# Patient Record
Sex: Male | Born: 1955 | Race: White | Hispanic: No | Marital: Single
Health system: Southern US, Community
[De-identification: ages and names within clinical notes are randomized; demographics above are authoritative.]

---

## 2006-02-27 ENCOUNTER — Encounter: Admission: RE | Admit: 2006-02-27 | Discharge: 2006-02-27 | Payer: Self-pay | Admitting: Cardiology

## 2006-03-05 ENCOUNTER — Ambulatory Visit (HOSPITAL_COMMUNITY): Admission: RE | Admit: 2006-03-05 | Discharge: 2006-03-05 | Payer: Self-pay | Admitting: Cardiology

## 2010-10-19 ENCOUNTER — Inpatient Hospital Stay: Payer: Self-pay | Admitting: Psychiatry

## 2011-05-13 ENCOUNTER — Inpatient Hospital Stay: Payer: Self-pay | Admitting: Psychiatry

## 2011-10-17 ENCOUNTER — Observation Stay: Payer: Self-pay | Admitting: Internal Medicine

## 2011-10-17 LAB — CBC
HCT: 43.9 % (ref 40.0–52.0)
HGB: 14.5 g/dL (ref 13.0–18.0)
MCHC: 33.1 g/dL (ref 32.0–36.0)
MCV: 88 fL (ref 80–100)
RBC: 5 10*6/uL (ref 4.40–5.90)
RDW: 14 % (ref 11.5–14.5)
WBC: 8.8 10*3/uL (ref 3.8–10.6)

## 2011-10-17 LAB — DRUG SCREEN, URINE
Barbiturates, Ur Screen: NEGATIVE (ref ?–200)
Cannabinoid 50 Ng, Ur ~~LOC~~: POSITIVE (ref ?–50)
MDMA (Ecstasy)Ur Screen: NEGATIVE (ref ?–500)
Methadone, Ur Screen: NEGATIVE (ref ?–300)
Opiate, Ur Screen: NEGATIVE (ref ?–300)
Tricyclic, Ur Screen: NEGATIVE (ref ?–1000)

## 2011-10-17 LAB — COMPREHENSIVE METABOLIC PANEL
Anion Gap: 9 (ref 7–16)
Calcium, Total: 9.1 mg/dL (ref 8.5–10.1)
Creatinine: 0.84 mg/dL (ref 0.60–1.30)
EGFR (African American): >60
EGFR (Non-African Amer.): >60
Glucose: 266 mg/dL — ABNORMAL HIGH (ref 65–99)
Osmolality: 279 (ref 275–301)
Potassium: 3.7 mmol/L (ref 3.5–5.1)
SGPT (ALT): 36 U/L

## 2011-10-17 LAB — URINALYSIS, COMPLETE
Bacteria: NONE SEEN
Bilirubin,UR: NEGATIVE
Leukocyte Esterase: NEGATIVE
Nitrite: NEGATIVE
Ph: 6 (ref 4.5–8.0)
Specific Gravity: 1.055 (ref 1.003–1.030)
WBC UR: 1 /HPF (ref 0–5)

## 2011-10-17 LAB — TROPONIN I: Troponin-I: <0.02 ng/mL

## 2011-10-18 LAB — CBC WITH DIFFERENTIAL/PLATELET
Basophil %: 0.9 %
Eosinophil #: 0.2 10*3/uL (ref 0.0–0.7)
HCT: 41.1 % (ref 40.0–52.0)
HGB: 13.6 g/dL (ref 13.0–18.0)
Lymphocyte %: 28 %
Platelet: 235 10*3/uL (ref 150–440)
RBC: 4.71 10*6/uL (ref 4.40–5.90)
RDW: 14.1 % (ref 11.5–14.5)
WBC: 7.4 10*3/uL (ref 3.8–10.6)

## 2011-10-18 LAB — LIPID PANEL
HDL Cholesterol: 21 mg/dL — ABNORMAL LOW (ref 40–60)
Triglycerides: 497 mg/dL — ABNORMAL HIGH (ref 0–200)

## 2011-10-18 LAB — BASIC METABOLIC PANEL
Anion Gap: 9 (ref 7–16)
Chloride: 98 mmol/L (ref 98–107)
Co2: 27 mmol/L (ref 21–32)
EGFR (African American): >60
EGFR (Non-African Amer.): >60
Osmolality: 276 (ref 275–301)
Potassium: 3.6 mmol/L (ref 3.5–5.1)
Sodium: 134 mmol/L — ABNORMAL LOW (ref 136–145)

## 2011-10-18 LAB — HEMOGLOBIN A1C: Hemoglobin A1C: 11.8 % — ABNORMAL HIGH (ref 4.2–6.3)

## 2011-10-18 LAB — CK TOTAL AND CKMB (NOT AT ARMC)
CK, Total: 71 U/L (ref 35–232)
CK-MB: 1.3 ng/mL (ref 0.5–3.6)

## 2011-10-18 LAB — TROPONIN I: Troponin-I: <0.02 ng/mL

## 2013-10-01 IMAGING — CT CT CHEST W/ CM
1 of 2 series · 15 of 35 positions shown, 19 images · non-contrast
Comparison: none

REASON FOR EXAM: pain and to also rule out dissection
COMMENTS:

PROCEDURE:     CT  - CT CHEST (FOR PE) W  - October 17, 2011  [DATE]
RESULT:     Chest CT dated 10/17/2011 comparison made to prior study dated
10/18/2010.
TECHNIQUE: Helical 3mm sections were obtained from the thoracic inlet
through the lung bases status post intravenous administration of 100 mL of
Ksovue-I89.

[Series 4: soft tissue · axial · 0.81mm/px · z∈[-382,-91]mm · 15 of 105 slices shown, 19 images]
[im 4/105  mediastinal]
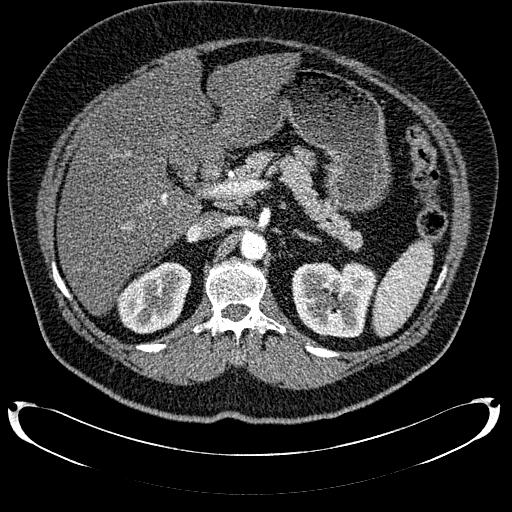
[im 4/105  lung]
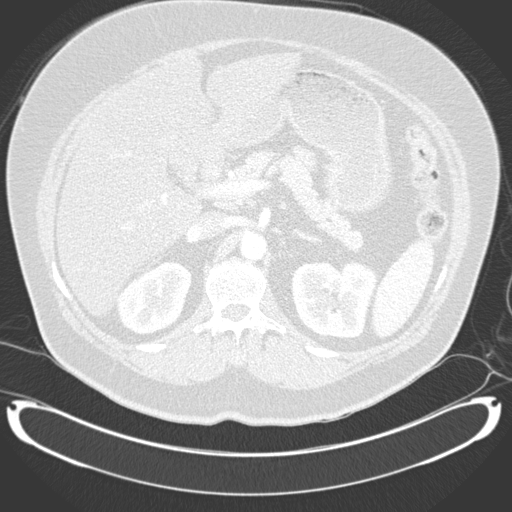
[im 12/105  lung]
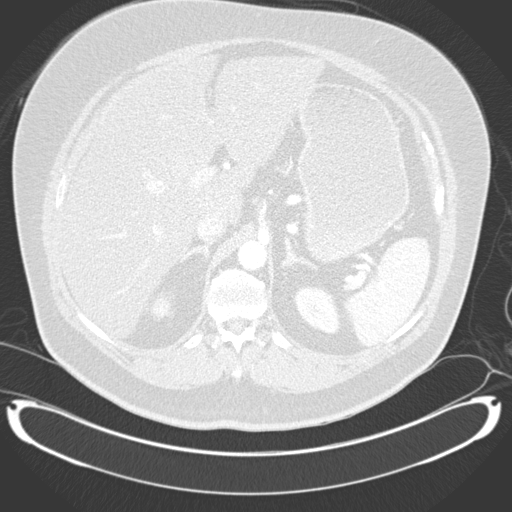
[im 20/105  lung]
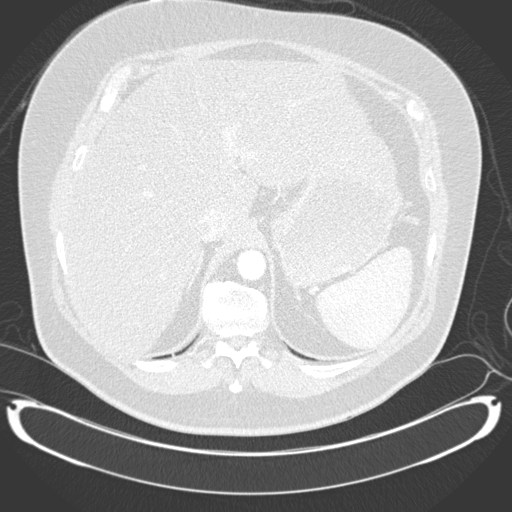
[im 27/105  lung]
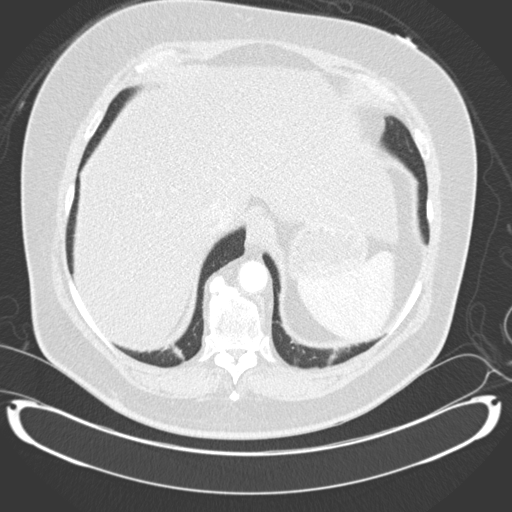
[im 35/105  mediastinal]
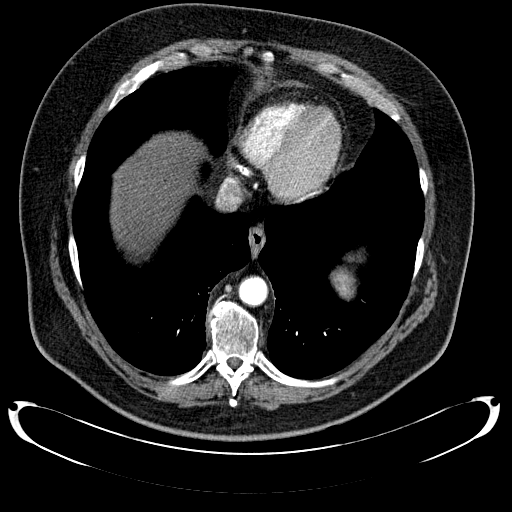
[im 35/105  lung]
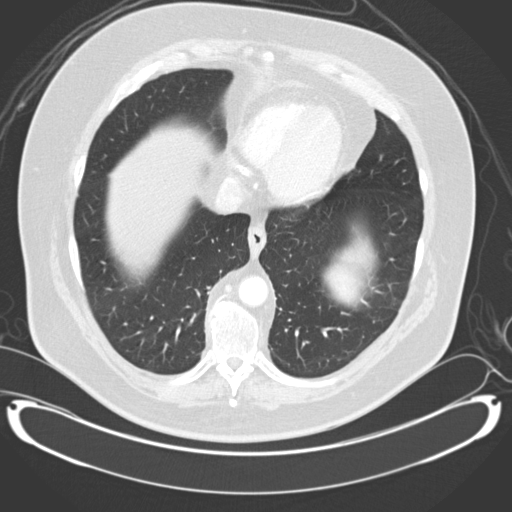
[im 39/105  lung]
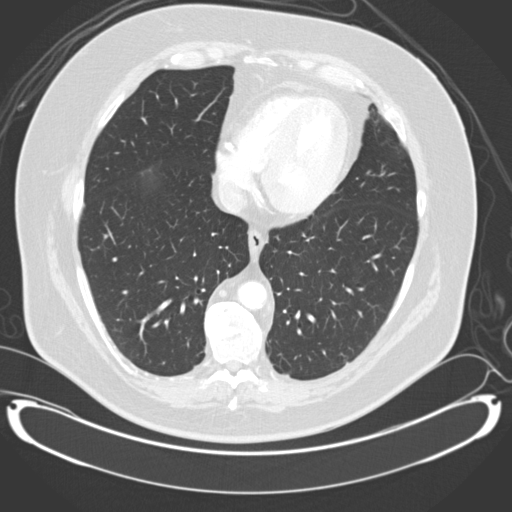
[im 47/105  lung]
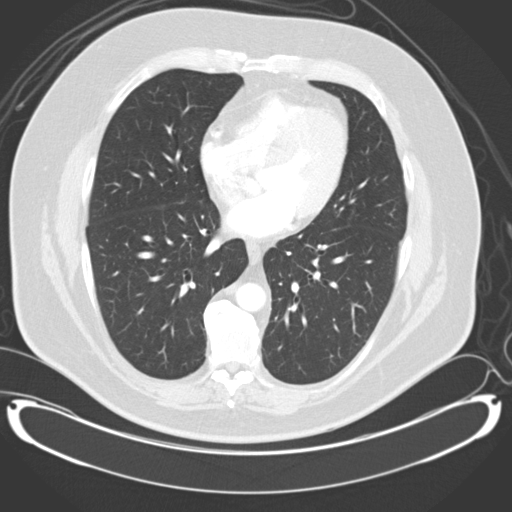
[im 54/105  lung]
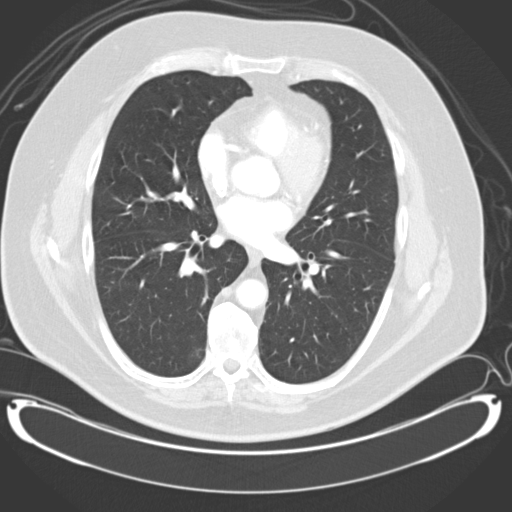
[im 59/105  mediastinal]
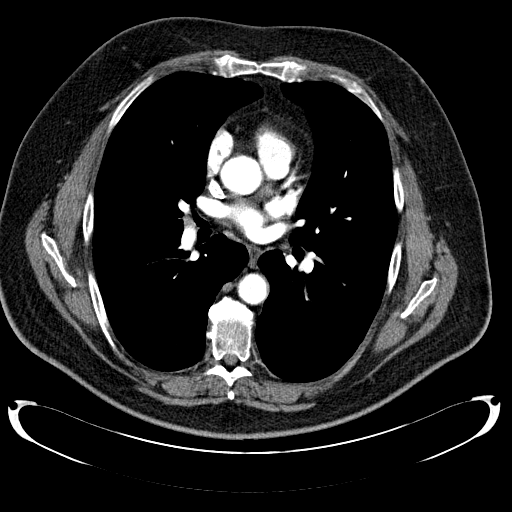
[im 59/105  lung]
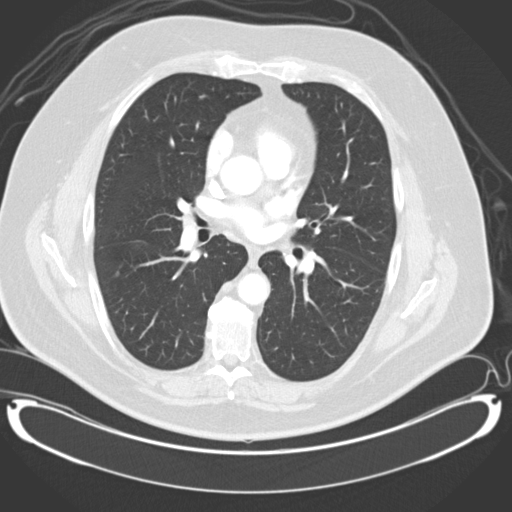
[im 66/105  lung]
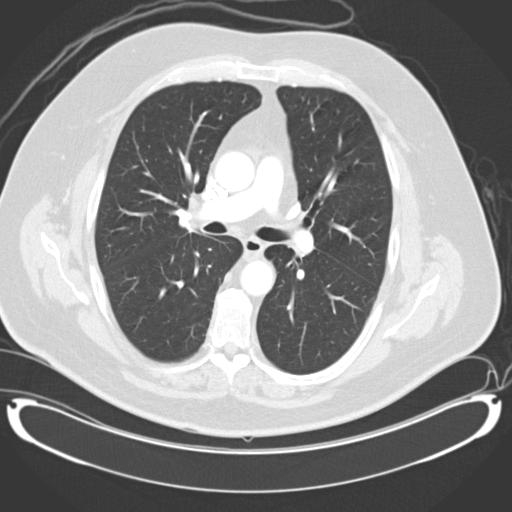
[im 70/105  lung]
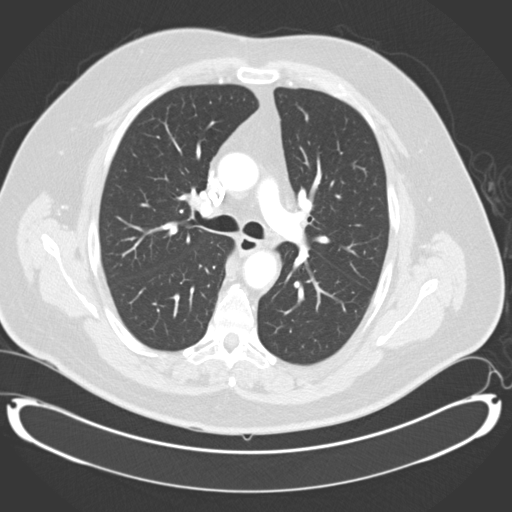
[im 78/105  lung]
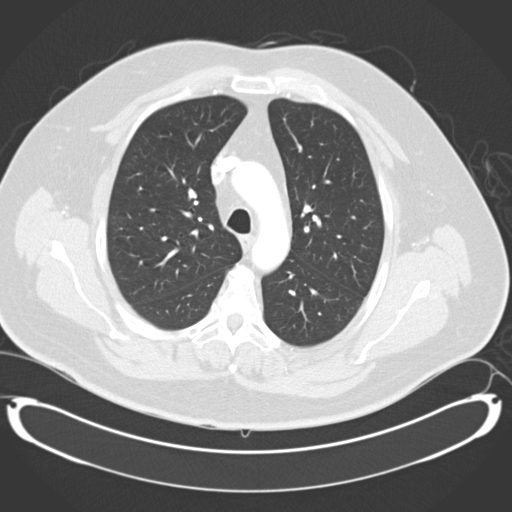
[im 85/105  mediastinal]
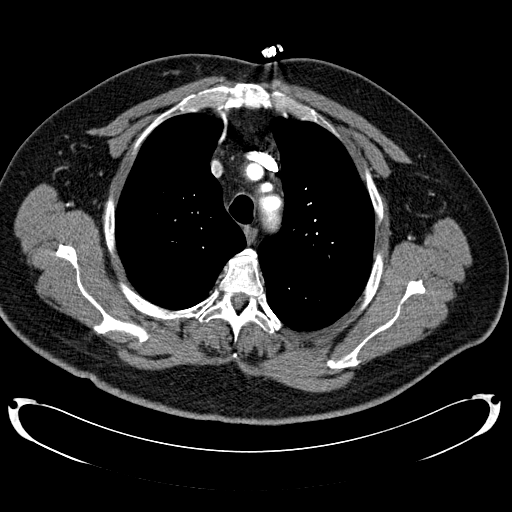
[im 85/105  lung]
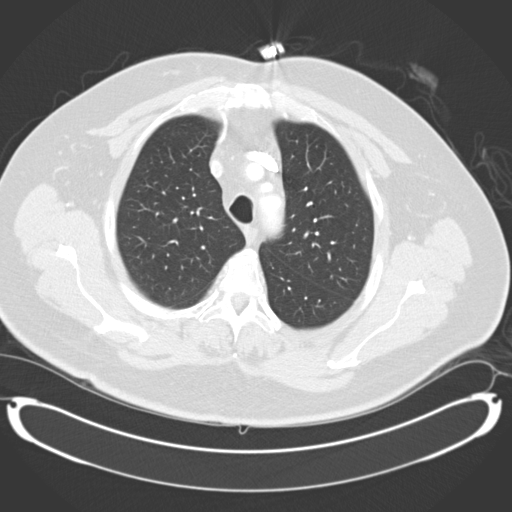
[im 93/105  lung]
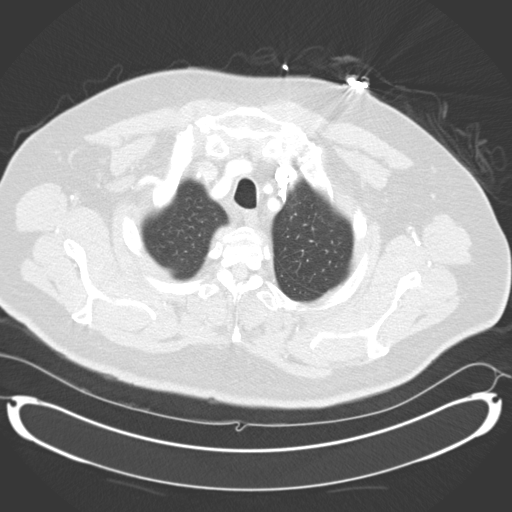
[im 101/105  lung]
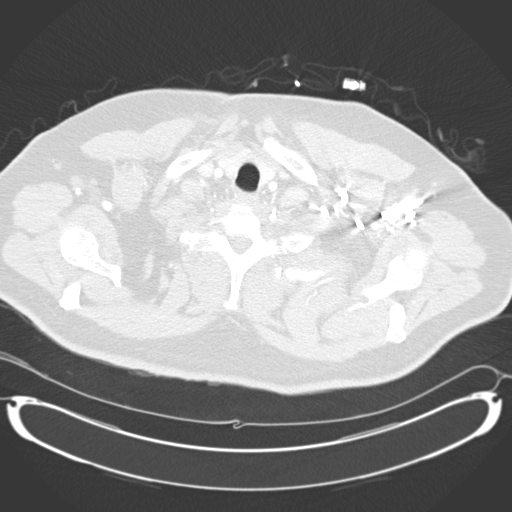

[15 of 35 positions shown; findings below may reference images not displayed]

FINDINGS: The mediastinum hilar regions and structures demonstrate no
evidence of mediastinal or hilar adenopathy nor masses. No filling defects
are appreciated within the main, lobar, or segmental pulmonary arteries.

The lung parenchyma demonstrates no evidence of focal infiltrates,
effusions, edema, masses, nor nodules.  Areas of increased density are
vaguely appreciated within the lung bases like representing fibrosis or
atelectasis.

There is diffuse low-attenuation within the liver. The remaining visualized
upper abdominal viscera are unremarkable.
IMPRESSION: 1. No CT evidence of pulmonary arterial embolic disease.
2. Hepatic steatosis.

## 2014-10-30 NOTE — H&P (Signed)
PATIENT NAME:  Anthony Lloyd, Anthony Lloyd MR#:  [id number] DATE OF BIRTH:  06/26/1956  DATE OF ADMISSION:  10/17/2011  REFERRING PHYSICIAN:  Dr. Ladona Ridgel PRIMARY CARE PHYSICIAN:  [facility] PRIMARY PAIN MANAGEMENT SPECIALIST:  [facility] PRIMARY PSYCHIATRIST:  Dr. Marguerite Olea at [facility]  CHIEF COMPLAINT: "My blood pressure is really high and I am hurting in my chest."   HISTORY OF PRESENT ILLNESS: The patient is a 59 year old male with past medical history listed below including hypertension, diabetes mellitus, depression, chronic back pain, neuropathy, history of suicidality who presented to the Emergency Department with the above-mentioned chief complaint. The patient states that he ran out of his blood pressure medication last night and has not taken any of his blood pressure medications today. He has had some discomfort in the center of his chest. He came to the ER and was noted to have malignant hypertension with blood pressure 241/135. He states he has been hurting in the center of his chest on and off for most of his life, but this has been worse over the past couple of weeks. He reports substernal chest pain which is nonradiating and nonexertional; pain is actually better with moving and walking around and worse while he is sitting. He did take some aspirin today, which led to mild relief of his chest pain. His chest pain is characterized as a sharp sensation in the center of his chest.  His current chest pain is 8.5/10 in intensity. He states that he has had chest pain most of his life, which is attributed to his history of kyphosis. He denies shortness of breath. Otherwise, he is without specific complaints at this time. Given chest pain and malignant hypertension, hospitalist services were contacted for further evaluation and hospital admission.   PAST SURGICAL HISTORY:  1. Low back surgery times two.  2. Sinus surgery. 3. Left rotator cuff surgery. 4. Lymph node in the abdomen. 5. Eye  surgery.   PAST MEDICAL HISTORY:  1. Hypertension.  2. Type 2 diabetes mellitus. 3. Chronic back pain.  4. History of rotator cuff problems involving the right shoulder and also history of rotator cuff surgery on the left side.  5. History of kyphosis.  6. History of depression.  7. 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  8. History of posttraumatic stress disorder which is chronic.  9. History of panic disorder with agoraphobia.  10. 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  11. History of suicidality with several suicide attempts in the past.  12. History of anxiety. 13. Chronic chest pain.  The patient states he has had chest pain for most of his life and attributes this to history of kyphosis.   ALLERGIES: Penicillin and bee stings.   HOME MEDICATIONS:  1. Aspirin 325 mg daily.  2. Norvasc. He thinks he is taking 5 mg daily. 3. Lisinopril. He thinks he is taking either 5 or 10 mg a day. 4. HCTZ. He thinks he is taking 25 mg a day. 5. Coreg 25 mg once a day, but in the past he was advised to take this on a b.i.d. basis.  6. Glucophage daily.  7. Glipizide daily.  8. Gabapentin unknown strength  t.i.d.  9. Cymbalta 90 mg daily. 10. Abilify 5 mg daily.   On the most recent discharge summary from behavior medicine November 2012 he was supposed to have been on the following medications:   1. Amitriptyline 100 mg at bedtime.  2. Norvasc  5 mg daily.  3. Abilify 2 mg daily. 4. Aspirin 325 mg daily. 5. Coreg 25 mg b.i.d.  6. Neurontin 300 mg t.i.d. 7. HCTZ 25 mg daily. 8. Lisinopril 10 mg daily. 9. Metformin 500 mg b.i.d.  10. Multivitamin 1 tablet daily. 11. Pravastatin 20 mg daily.   FAMILY HISTORY:  Significant for heart disease in multiple members with heart attacks on his paternal side. Father also had died in his 550s secondary to myocardial infarction. Sister died of pancreatic cancer. Mother died at  age 59 secondary to pancreatic cancer.   SOCIAL HISTORY: Negative for tobacco, alcohol, or illicit drug use.   REVIEW OF SYSTEMS:  CONSTITUTIONAL: Reports chest pain. Denies fevers, chills, recent change in weight, or weakness.  No fatigue.  HEAD/EYES:  Denies headache or blurry vision. ENT: Denies tinnitus, earache, nasal discharge, or sore throat. RESPIRATORY: Denies shortness breath, cough, or wheezing. CARDIOVASCULAR: Reports chest pain. Denies heart palpitations or lower extremity edema. GI: Denies nausea, vomiting, diarrhea, constipation, melena, hematochezia, or abdominal pains. GU: Denies dysuria or hematuria. ENDOCRINE: Denies heat or cold intolerance. HEME/LYMPH: Denies easy bruising or bleeding. INTEGUMENT: Denies rashes or lesions. MUSCULOSKELETAL:  Has chronic back pain with some pain in his chest for most of his life, worse over the past few weeks. NEUROLOGIC: Denies headache, numbness, weakness, tingling, or dysarthria. PSYCH: Has underlying depression and anxiety. Denies suicidal or homicidal ideation.  PHYSICAL EXAMINATION: VITAL SIGNS:  Temperature 98.5, pulse 78, blood pressure 241/135, respirations 20, oxygen saturation 97% on room air.   GENERAL: The patient is alert and oriented, not acutely distressed.    HEENT: Normocephalic, atraumatic. Pupils are equal, round, and reactive to light accommodation. Extraocular muscles are intact. Anicteric sclerae. Conjunctivae pink. Hearing intact to voice. Nares without drainage. Oral mucosa moist without lesions.   NECK: Supple with full range of motion. No jugular venous distention, lymphadenopathy, or carotid bruits bilaterally. No thyromegaly or tenderness to palpation over the thyroid gland.   CHEST: Normal respiratory effort without use of accessory respiratory muscles. Lungs are clear to auscultation bilaterally without crackles, rales, or wheezing.  CARDIOVASCULAR: S1, S2 positive. Regular rate and rhythm. No murmurs, rubs, or  gallops. PMI is non-lateralized. No tenderness to palpation over anterior chest wall.    ABDOMEN: Obese, soft, nontender, nondistended. Normoactive bowel sounds. No hepatosplenomegaly or palpable masses. No hernias.   EXTREMITIES: No clubbing, cyanosis, or edema. Pedal pulses are palpable bilaterally.   SKIN: No suspicious rashes. Skin turgor is good.   LYMPH: No cervical lymphadenopathy.   NEUROLOGIC: Alert and oriented times three. Cranial nerves II to XII grossly intact. No focal deficits.   PSYCH: Appropriate affect.   LABORATORY, DIAGNOSTIC, AND RADIOLOGICAL DATA: EKG sinus tachycardia at 108 beats per minute with normal axis, normal intervals, nonspecific ST and T wave changes. No acute ischemic changes noted.   PORTABLE CHEST X-RAY: No acute cardiopulmonary abnormalities are noted.   Chest CT with PE protocol: No CT evidence of pulmonary embolus. There is hepatic steatosis. Lung parenchyma demonstrates no evidence of focal infiltrates, effusions, edema, masses, or nodules.   Complete metabolic panel: Sodium 134, potassium 3.7, chloride 95, bicarbonate 30, BUN 18, creatinine 0.84, glucose 266, anion gap 9. Total protein, albumin, total bilirubin, alkaline phosphatase, AST, and ALT levels are within normal limits. CBC within normal limits.   Troponin less than 0.02.   BNP is 110.   ASSESSMENT AND PLAN: 59 year old male with past medical history of hypertension, type 2 diabetes mellitus, major depressive disorder, anxiety,  previous suicide attempts, chronic back pain, and chronic chest pain here with:  1. Malignant hypertension: Suspect secondary to medication noncompliance as the patient has run of his blood pressure medications.  There seems to be some issue with his medicaid and currently not being care to seek care with his primary care physician and therefore unable to obtain refills on his blood pressure medications. As a result he ran out of his blood pressure medications and  now comes in with malignant hypertension. We will admit patient to observation status and start gradual blood pressure reduction. He has received a dose of IV labetalol and hydralazine in the ER. We do not want to lower the patient's blood pressure too abruptly especially within the first 24 hours. For optimal management would like to resume his usual antihypertensives upon discharge to maintain continuity, since he has been taking these agents in the recent past. We will reinitiate him on Norvasc, lisinopril, HCTZ, and Coreg and gradually reduce his blood pressure and adjust doses of these medications as needed, and also write for p.r.n. IV hydralazine in the event of severe uncontrolled hypertension. We will monitor the patient's blood pressure closely. Also provide low sodium diet. Further work-up and management to follow depending on the patient's clinical course.  2. Chest pain:  This appears very typical.  Chest pain was nonexertional and actually improves with exertion and walking and gets worse with sitting. He states his chest pain has been present for most of his life and attributes this to his kyphosis. Suspect noncardiac etiology of his chest pain. However, the patient has multiple poorly-controlled risk factors for coronary artery disease and warrants further work-up. Therefore, we will rule out myocardial infarction by continuing to cycle his cardiac enzymes. In the meanwhile, we will keep the patient on oxygen, aspirin, beta blocker, ACE inhibitor, and p.r.n. nitrate therapy, and also risk stratify by checking fasting lipid profile in the a.m. as well as hemoglobin A1c. If the patient rules out for myocardial infarction we will proceed with treadmill Myoview tomorrow morning. Further work-up and management to follow depending on the patient's clinical course. Also provide p.r.n. pain control.  3. Type 2 diabetes mellitus: The patient will be n.p.o. after midnight in anticipation of Myoview, and  therefore to prevent hypoglycemia we will hold his sulfonylurea therapy for now. Also hold Glucophage. Keep the patient on sliding scale insulin for now and ADA diet for now until he is n.p.o. after midnight. Monitor sugars closely and keep him on sliding scale insulin. Check hemoglobin A1c. 4. Depression/anxiety: Continue Cymbalta and Abilify. He needs to continue to follow up with his primary psychiatrist, Dr. Marguerite Olea at [place]. He currently denies any suicidal or homicidal ideation.  5. Chronic pain syndrome: The patient will need to continue to follow up with his pain clinic at [facility]. In the hospital we will provide p.r.n. pain control. We will attempt to achieve pain control with Tylenol for now.  6. History of neuropathy: Continue gabapentin.  7. Deep vein thrombosis prophylaxis: Lovenox.  8. CODE STATUS: FULL CODE.  TIME SPENT ON THIS ADMISSION: Approximately 45 minutes.   ____________________________ Elon Alas, MD knl:bjt D: 10/17/2011 19:31:24 ET T: 10/18/2011 06:15:01 ET JOB#: [id number]  cc: Elon Alas, MD, <Dictator> [facility] Dr. Marguerite Olea at [facility]  [address] Vence Lalor MD ELECTRONICALLY SIGNED 10/29/2011 17:50

## 2014-10-30 NOTE — Consult Note (Signed)
PATIENT NAME:  Michela PitcherALLEY, Harve MR#:  [id number] DATE OF BIRTH:  12-29-1955  DATE OF CONSULTATION:  10/18/2011  REFERRING PHYSICIAN:  Camillo FlamingKamran Lateef, MD CONSULTING PHYSICIAN:  Laurier NancyShaukat A. Celines Femia, MD  INDICATION FOR CONSULTATION: Chest pain.   HISTORY OF PRESENT ILLNESS: The patient is a 59 year old pleasant white male with a past medical history of diabetes, hypertension, hypercholesterolemia, who was seen by Dr. Camillo FlamingKamran Lateef in the Emergency Room with chest pain. The chest pain was sharp, associated with shortness of breath. He has history of back pains, and this is the first time he had chest pain. For the past two to 3 days it got worse, thus, he came into the Emergency Room. He is chest pain free now.   PAST MEDICAL HISTORY:  1. History of hypertension. 2. Diabetes. 3. Hypercholesterolemia.   SOCIAL HISTORY: He denies EtOH abuse. He does smoke 1-1/2 packs per day.   FAMILY HISTORY: Positive for coronary artery disease. His father had a stent and died of myocardial infarction.   ALLERGIES: None.   PHYSICAL EXAMINATION:  GENERAL: She is alert, oriented x3, in no acute distress.   VITAL SIGNS:  Vitals are stable.   NECK: Exam revealed no JVD.   LUNGS: Clear.   HEART: Regular rate and rhythm. Normal S1, S2. No audible murmur.   ABDOMEN: Soft, nontender, positive bowel sounds.   EXTREMITIES: No pedal edema.   LABORATORY, DIAGNOSTIC AND RADIOLOGICAL DATA:  EKG showed normal sinus rhythm. No acute changes.  Cardiac enzymes are negative.  Stress Myoview was done which showed normal study with no perfusion defect. Ejection fraction was 68%.   CONCLUSION: The patient has atypical chest pain most likely secondary to musculoskeletal chest pain. Stress Myoview was negative with ejection fraction of 68%. I advise follow-up in the office.  ____________________________ Laurier NancyShaukat A. Les Longmore, MD sak:cbb D: 10/18/2011 12:28:34 ET T: 10/18/2011 12:47:45 ET JOB#: [id number]  cc: Laurier NancyShaukat A. Savvy Peeters,  MD, <Dictator> Laurier NancySHAUKAT A Kolbee Stallman MD ELECTRONICALLY SIGNED 10/24/2011 9:04

## 2014-10-30 NOTE — Discharge Summary (Signed)
PATIENT NAME:  Anthony Lloyd, Anthony Lloyd MR#:  [id number] DATE OF BIRTH:  1955/08/10  DATE OF ADMISSION:  10/17/2011 DATE OF DISCHARGE:  10/18/2011  FINAL DIAGNOSES:  1. Chest pain, musculoskeletal.  2. Diabetes, uncontrolled.  3. Malignant hypertension.  4. Hypertriglyceridemia.  5. Chronic pain.  6. Depression.   DISCHARGE MEDICATIONS: 1. Coreg 25 mg twice a day.  2. Gabapentin 300 mg three times daily. 3. Aspirin 325 mg daily.  4. Glucophage 1000 mg twice a day.  5. Cymbalta 60 mg daily.  6. Abilify 5 mg daily.  7. Hydrochlorothiazide 25 mg p.o. daily.  8. Lisinopril 10 mg p.o. daily.  9. Norvasc 2.5 mg p.o. daily.  10. Gemfibrozil 600 mg p.o. twice a day.  11. Norco 5/325 mg one tablet every eight hours as needed for pain.  12. Lantus 12 units subcutaneous injection nightly. 13. Apidra 5 units subcutaneous injection prior to meals. 14. Glipizide 5 mg p.o. twice a day.   DIET: Low sodium, 1800 ADA diet.   ACTIVITY: Activity as tolerated.   DISCHARGE FOLLOWUP: Followup with Dr. Adrian Blackwater, cardiologist, in 2 weeks, and followup in 2 to 4 weeks with the doctor on his Medicaid card.   REASON FOR ADMISSION: The patient was admitted as an observation on 10/17/2011 and discharged 10/18/2011. He came in with blood pressure high, hurting in the chest.   HISTORY OF PRESENT ILLNESS: This is a 59 year old man with hypertension, diabetes, depression, chronic back pain, and neuropathy. In the ER, his blood pressure was 241/135. Chest pain is described as sharp, in the center of his chest, 8.5 to 5 in intensity. The patient was admitted with malignant hypertension, noncompliance with medications, and started on antihypertensives. He was admitted with chest pain as atypical and ordered a stress test. For his diabetes, a Hemoglobin A1c was ordered.   LABS/STUDIES: Cardiac enzymes were negative. Urine toxicology positive for cannabis. BNP 110. Urinalysis negative. Troponin negative. Glucose 266, BUN  18, creatinine 0.84, sodium 134, potassium 3.7, chloride 95, CO2 30, and calcium 9.1. Liver function tests normal. White blood cell count 8.8, hemoglobin and hematocrit 14.5 and hematocrit 43.9, and platelet count 236.   Chest x-ray was negative.   CT scan of the chest with contrast showed no evidence of pulmonary embolic disease. Hepatic steatosis.   Next troponin negative. Triglycerides 497, total cholesterol 211, and HDL 21. Hemoglobin A1c 11.8. Last cardiac enzymes negative. CBC upon discharge with WBC 7.4, hemoglobin 13.6, and platelet count 235. Glucose upon discharge 224, BUN 14, creatinine 0.7, sodium 134, potassium 3.6, chloride 98, and CO2 27.   Stress test normal study, normal left ventricular systolic function.   HOSPITAL COURSE:  1. The patient's chest pain is musculoskeletal. Stress test and CT scan of the chest were negative.  2. For the patient's diabetes, uncontrolled with a Hemoglobin A1c above 11. Lantus was started and Apidra prior to meals along with his Glucophage and glipizide. Fingersticks recommended before meals and at bedtime. Diabetic teaching done by nurse. The patient has been on insulin before in the past.  3. Malignant hypertension, most likely noncompliance with medications Coreg, Norvasc, lisinopril, and hydrochlorothiazide re-prescribed. Blood pressure this morning was actually a little low so hydrochlorothiazide and Norvasc were held, but blood pressure this afternoon was higher so they were restarted. Blood pressure upon discharge was 161/93.  4. For his hypertriglyceridemia a low carbohydrate diet was discussed. The patient started on gemfibrozil.  5. For his chronic pain, he needs to have primary care physician to  [facility]. I did write a small prescription for Norco.  6. For his depression, he is on Cymbalta and Abilify.  TIME SPENT ON DISCHARGE: 35 minutes.  ____________________________ Herschell Dimesichard J. Renae GlossWieting, MD rjw:slb D: 10/18/2011  16:46:59 ET T: 10/21/2011 09:32:30 ET JOB#: [id number]  cc: Herschell Dimesichard J. Renae GlossWieting, MD, <Dictator> Laurier NancyShaukat A. Khan, MD Salley ScarletICHARD J Willona Phariss MD ELECTRONICALLY SIGNED 10/21/2011 13:56

## 2023-02-06 DEATH — deceased
